# Patient Record
Sex: Female | Born: 2000 | Race: White | Hispanic: No | Marital: Single | Smoking: Never smoker
Health system: Southern US, Community
[De-identification: ages and names within clinical notes are randomized; demographics above are authoritative.]

## PROBLEM LIST (undated history)

## (undated) DIAGNOSIS — T7840XA Allergy, unspecified, initial encounter: Secondary | ICD-10-CM

## (undated) HISTORY — DX: Allergy, unspecified, initial encounter: T78.40XA

---
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
## 2018-02-02 DIAGNOSIS — N760 Acute vaginitis: Secondary | ICD-10-CM | POA: Diagnosis not present

## 2018-07-05 DIAGNOSIS — R319 Hematuria, unspecified: Secondary | ICD-10-CM | POA: Diagnosis not present

## 2018-07-05 DIAGNOSIS — R300 Dysuria: Secondary | ICD-10-CM | POA: Diagnosis not present

## 2018-09-06 DIAGNOSIS — J029 Acute pharyngitis, unspecified: Secondary | ICD-10-CM | POA: Diagnosis not present

## 2018-12-06 DIAGNOSIS — H93292 Other abnormal auditory perceptions, left ear: Secondary | ICD-10-CM | POA: Diagnosis not present

## 2018-12-06 DIAGNOSIS — J309 Allergic rhinitis, unspecified: Secondary | ICD-10-CM | POA: Diagnosis not present

## 2018-12-06 DIAGNOSIS — H6983 Other specified disorders of Eustachian tube, bilateral: Secondary | ICD-10-CM | POA: Diagnosis not present

## 2019-05-28 DIAGNOSIS — Z20828 Contact with and (suspected) exposure to other viral communicable diseases: Secondary | ICD-10-CM | POA: Diagnosis not present

## 2019-07-13 DIAGNOSIS — Z20828 Contact with and (suspected) exposure to other viral communicable diseases: Secondary | ICD-10-CM | POA: Diagnosis not present

## 2019-08-18 DIAGNOSIS — Z20828 Contact with and (suspected) exposure to other viral communicable diseases: Secondary | ICD-10-CM | POA: Diagnosis not present

## 2019-09-25 DIAGNOSIS — Z20828 Contact with and (suspected) exposure to other viral communicable diseases: Secondary | ICD-10-CM | POA: Diagnosis not present
